# Patient Record
Sex: Male | Born: 1970 | Race: Asian | Hispanic: No | Marital: Married | Smoking: Never smoker
Health system: Southern US, Community
[De-identification: ages and names within clinical notes are randomized; demographics above are authoritative.]

---

## 2006-02-08 ENCOUNTER — Ambulatory Visit: Payer: Self-pay | Admitting: Internal Medicine

## 2007-07-24 ENCOUNTER — Emergency Department (HOSPITAL_COMMUNITY): Admission: EM | Admit: 2007-07-24 | Discharge: 2007-07-25 | Payer: Self-pay | Admitting: Emergency Medicine

## 2013-07-20 ENCOUNTER — Ambulatory Visit: Payer: Managed Care, Other (non HMO) | Admitting: Internal Medicine

## 2013-07-20 ENCOUNTER — Ambulatory Visit: Payer: Managed Care, Other (non HMO)

## 2013-07-20 VITALS — BP 130/90 | HR 74 | Temp 98.0°F | Resp 16 | Ht 65.0 in | Wt 165.0 lb

## 2013-07-20 DIAGNOSIS — M545 Low back pain: Secondary | ICD-10-CM

## 2013-07-20 MED ORDER — MELOXICAM 15 MG PO TABS: 15.0000 mg | ORAL_TABLET | Freq: Every day | ORAL | Status: DC

## 2013-07-20 MED ORDER — CYCLOBENZAPRINE HCL 10 MG PO TABS: 10.0000 mg | ORAL_TABLET | Freq: Three times a day (TID) | ORAL | Status: DC | PRN

## 2013-07-20 NOTE — Patient Instructions (Addendum)
Stop Ibuprofen while taking Meloxicam Take flexeril as directed for muscle spasms, can cause sleepiness so may want to take before bedtime  Referral for physical therapy to help with muscle spasms If you are not improving in 2 weeks please return

## 2013-07-20 NOTE — Progress Notes (Signed)
I have examined this patient along with the student and agree.  

## 2013-07-20 NOTE — Progress Notes (Signed)
Subjective:    Patient ID: Jacob Irwin, male    DOB: 04-02-1971, 42 y.o.   MRN: [id number]  HPI 42 y.o. Male presents to clinic today for back pain x 2.5 years. Patient states that there was no initial injury that caused pain. Patients back pain has gotten worse over the past year. Patient has greatest pain when sitting for long periods of time, bending over, or lying down. The pain radiates down back of both legs but is worse on the right. Patient not having any difficulty urinating or moving bowels. Patient has been taking 400 mg of ibuprofen 2x/day every 2-3 days with some relief at first but now not having as much relief. Patient works for a Psychologist, prison and probation services and states that he does not have to lift very heavy objects or bend over. Patients exam limited by language barrier.    Review of Systems  Constitutional: Positive for activity change (2nd to pain and limited ROM ). Negative for fever, chills, diaphoresis and fatigue.  HENT: Negative for neck pain and neck stiffness.   Eyes: Negative for visual disturbance.  Respiratory: Negative for cough, chest tightness and shortness of breath.   Cardiovascular: Negative for chest pain and palpitations.  Gastrointestinal: Negative for nausea, vomiting, abdominal pain, diarrhea and constipation.  Genitourinary: Negative for difficulty urinating.  Musculoskeletal: Positive for myalgias, back pain, arthralgias and gait problem (2nd to pain in back ).  Skin: Negative for pallor and rash.  Neurological: Negative for weakness, light-headedness and headaches.  All other systems reviewed and are negative.       Objective:   Physical Exam  Nursing note and vitals reviewed. Constitutional: He is oriented to person, place, and time. Vital signs are normal. He appears well-developed and well-nourished. No distress.  HENT:  Head: Normocephalic and atraumatic.  Right Ear: External ear normal.  Left Ear: External ear normal.  Eyes: Conjunctivae are  normal.  Neck: Trachea normal and full passive range of motion without pain. Muscular tenderness (2nd to spasm ) present. No spinous process tenderness present. No rigidity. Decreased range of motion (2nd to muscle spasms and pain in lumbar spine ) present. No thyromegaly present.  Cardiovascular: Normal rate, regular rhythm, normal heart sounds and intact distal pulses.   Pulmonary/Chest: Effort normal and breath sounds normal.  Abdominal: Soft. Bowel sounds are normal.  Musculoskeletal: He exhibits tenderness.       Lumbar back: He exhibits decreased range of motion, tenderness, pain and spasm. He exhibits no bony tenderness, no swelling, no deformity and normal pulse.  Lymphadenopathy:    He has no cervical adenopathy.  Neurological: He is alert and oriented to person, place, and time. He has normal strength and normal reflexes. He displays normal reflexes. No cranial nerve deficit or sensory deficit. He exhibits normal muscle tone. Coordination normal.  Skin: Skin is warm, dry and intact. No rash noted. He is not diaphoretic.  Psychiatric: He has a normal mood and affect. His speech is normal and behavior is normal. Judgment and thought content normal. Cognition and memory are normal.    Lumbar Spine complete primary read by Dr. Merla Riches.  No signs of degenerative changes or acute processes. Loss of normal curvature of lumbar spine due to muscle spasms is evident.  .  Assessment & Plan:  LBP radiating to both legs - Plan: DG Lumbar Spine Complete, cyclobenzaprine (FLEXERIL) 10 MG tablet, meloxicam (MOBIC) 15 MG tablet, Ambulatory referral to Physical Therapy.  Instructed patient to stop Ibuprofen while taking Meloxicam.  Take flexeril as directed for muscle spasms. Warned him that it can cause sleepiness so may want to take before bedtime. Gave him a referral for physical therapy to help with muscle spasms.Instructed him that if he is not improving in 2 weeks to return for further evaluation.       I have reviewed and agree with documentation. Robert P. Merla Riches, M.D.

## 2013-08-03 ENCOUNTER — Ambulatory Visit: Payer: Managed Care, Other (non HMO) | Admitting: Internal Medicine

## 2013-08-03 VITALS — BP 140/88 | HR 93 | Temp 98.3°F | Resp 12 | Ht 64.6 in | Wt 164.0 lb

## 2013-08-03 DIAGNOSIS — M5126 Other intervertebral disc displacement, lumbar region: Secondary | ICD-10-CM

## 2013-08-03 DIAGNOSIS — M545 Low back pain: Secondary | ICD-10-CM

## 2013-08-03 DIAGNOSIS — M5116 Intervertebral disc disorders with radiculopathy, lumbar region: Secondary | ICD-10-CM

## 2013-08-03 MED ORDER — TRAMADOL HCL 50 MG PO TABS: 50.0000 mg | ORAL_TABLET | Freq: Four times a day (QID) | ORAL | Status: DC | PRN

## 2013-08-03 MED ORDER — PREDNISONE 20 MG PO TABS: ORAL_TABLET | ORAL | Status: DC

## 2013-08-03 NOTE — Progress Notes (Signed)
  Subjective:    Patient ID: Jacob Irwin, male    DOB: [birth year], 42 y.o.   MRN: [id number]  HPI here with an interpreter Flexeril seemed to make pain worse Meloxicam did not help Continues with lumbosacral pain on daily basis with radicular symptoms to the right lower leg No GU or GI dysfunction Remembers bending over and having a popping injury a 1 point No numbness or weakness   Review of Systems     Objective:   Physical Exam BP 140/88  Pulse 93  Temp(Src) 98.3 F (36.8 C) (Oral)  Resp 12  Ht 5' 4.6" (1.641 m)  Wt 164 lb (74.39 kg)  BMI 27.62 kg/m2  SpO2 98% No acute distress Tender over lumbosacral area generally Straight leg raise positive on the left at 90 with pain felt on the right//straight leg raise positive at 45 on the right with pain felt the right lumbosacral area Patellar reflexes 2+ symmetrical Clinical reflexes 00 No weakness to muscle testing       Assessment & Plan:  Lumbosacral pain  Lumbar disc herniation with radiculopathy  Meds ordered this encounter  Medications  . predniSONE (DELTASONE) 20 MG tablet    Sig: 4/3/3/2/2/1/1 single daily dose for 7 days    Dispense:  16 tablet    Refill:  0  . traMADol (ULTRAM) 50 MG tablet    Sig: Take 1-2 tablets (50-100 mg total) by mouth every 6 (six) hours as needed for pain.    Dispense:  30 tablet    Refill:  0   2 call if this doesn't work for [place] to refer to orthopedics to consider injection

## 2013-09-06 ENCOUNTER — Ambulatory Visit: Payer: Managed Care, Other (non HMO) | Admitting: Internal Medicine

## 2013-09-06 VITALS — BP 128/84 | HR 77 | Temp 98.1°F | Resp 18 | Ht 65.0 in | Wt 159.0 lb

## 2013-09-06 DIAGNOSIS — R202 Paresthesia of skin: Secondary | ICD-10-CM

## 2013-09-06 DIAGNOSIS — M419 Scoliosis, unspecified: Secondary | ICD-10-CM

## 2013-09-06 DIAGNOSIS — M549 Dorsalgia, unspecified: Secondary | ICD-10-CM

## 2013-09-06 DIAGNOSIS — M4120 Other idiopathic scoliosis, site unspecified: Secondary | ICD-10-CM

## 2013-09-06 MED ORDER — METHOCARBAMOL 750 MG PO TABS: 750.0000 mg | ORAL_TABLET | Freq: Four times a day (QID) | ORAL | Status: DC

## 2013-09-06 MED ORDER — METHYLPREDNISOLONE ACETATE 80 MG/ML IJ SUSP
120.0000 mg | Freq: Once | INTRAMUSCULAR | Status: AC
  Administered 2013-09-06: 120 mg via INTRAMUSCULAR

## 2013-09-06 NOTE — Progress Notes (Signed)
  Subjective:    Patient ID: Jacob Irwin, male    DOB: Jun 12, 1971, 42 y.o.   MRN: [id number]  HPI Pt here c/o of lower back pain radiating to right leg x 2 yrs got worse last 3 mths, bowel movents normal. Tingling right leg. Denies numbness. otc ibuprofen with some relief. No urinary symptoms. Dr. Merla Riches has evaluated and txed twice. Sxs continue, he has scoliosis and right disc signs. Using an interpretor Imaging reviewed  Review of Systems Neg/ [place]    Objective:   Physical Exam  Vitals reviewed. Constitutional: He is oriented to person, place, and time. He appears well-developed and well-nourished.  HENT:  Head: Normocephalic.  Eyes: EOM are normal.  Neck: Neck supple.  Cardiovascular: Normal rate.   Pulmonary/Chest: Effort normal.  Musculoskeletal: He exhibits tenderness.       Back:  Straight leg positive on right with both legs  Neurological: He is alert and oriented to person, place, and time. He has normal strength and normal reflexes. He displays normal reflexes. No cranial nerve deficit or sensory deficit. He exhibits normal muscle tone. Coordination and gait normal.  Skin: No rash noted.  Psychiatric: He has a normal mood and affect.          Assessment & Plan:  Depomedrol Robaxin prn

## 2013-09-06 NOTE — Patient Instructions (Addendum)
Scoliosis Scoliosis is the name given to a spine that curves sideways. It is a common condition found in up to ten percent of adolescents. It is more common in teenage girls. This is sometimes the result of other underlying problems such as unequal leg length or muscular problems. Approximately 70% of the time the cause unknown. It can cause twisting of the shoulders, hips, chest, back, and rib cage. Exercises generally do not affect the course of this disease, but may be helpful in strengthening weak muscle groups. Orthopedic braces may be needed during growth spurts. Surgery may be necessary for progressive cases. HOME CARE INSTRUCTIONS   Your caregiver may suggest exercises to strengthen your muscles. Follow their instructions. Ask your caregiver if you can participate in sports activities.  Bracing may be needed to try to limit the progression of the spinal curve. Wear the brace as instructed by your caregiver.  Follow-up appointments are important. Often mild cases of scoliosis can be kept track of by regular physical exams. However, periodic x-rays may be taken in more severe cases to follow the progress of the curvature, especially with brace treatment. Scoliosis can be corrected or improved if treated early. SEEK IMMEDIATE MEDICAL CARE IF:  You have back pain that is not relieved by medications prescribed by your caregiver.  If there is weakness or increased muscle tone (spasticity) in your legs or any loss of bowel or bladder control. Document Released: 12/02/2000 Document Revised: 02/27/2012 Document Reviewed: 12/22/2008 Exit[employer] Patient Information 2014 [employer].  

## 2014-12-03 IMAGING — CR DG LUMBAR SPINE COMPLETE 4+V
5 series · 5 of 5 positions shown · non-contrast
Comparison: None.

CLINICAL DATA: Chronic low back pain

LUMBAR SPINE - COMPLETE 4+ VIEW

[AP]
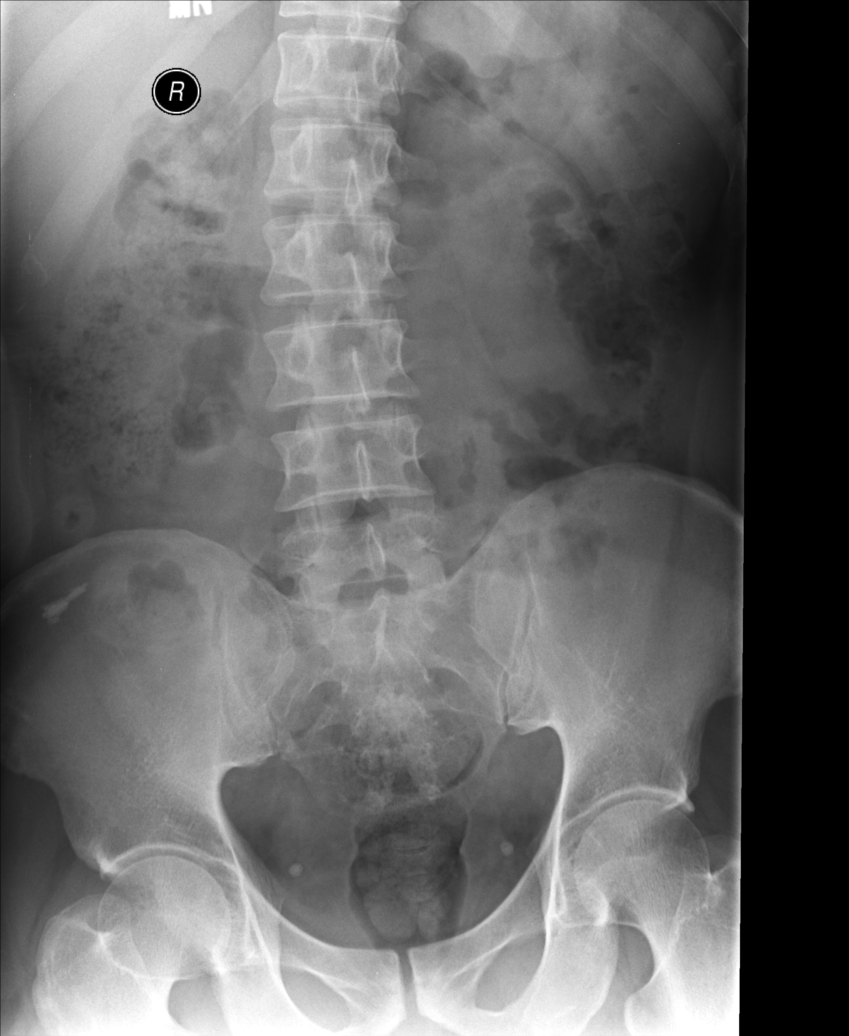

[rpo]
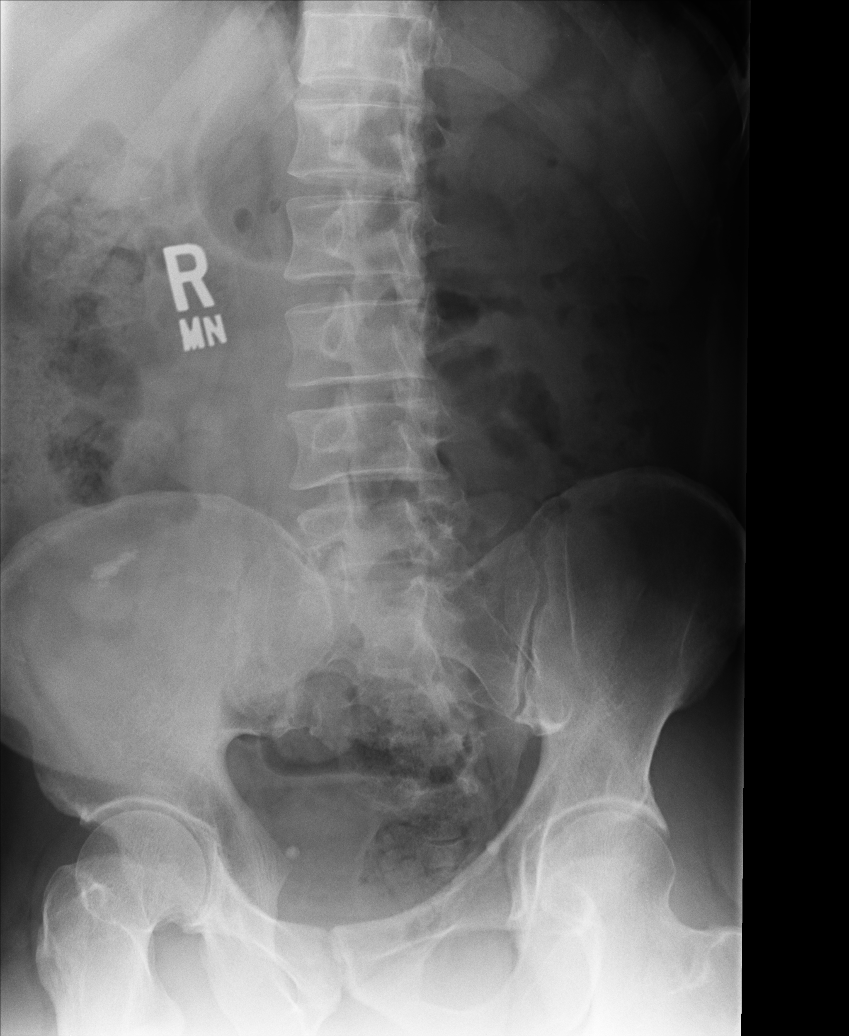

[lpo]
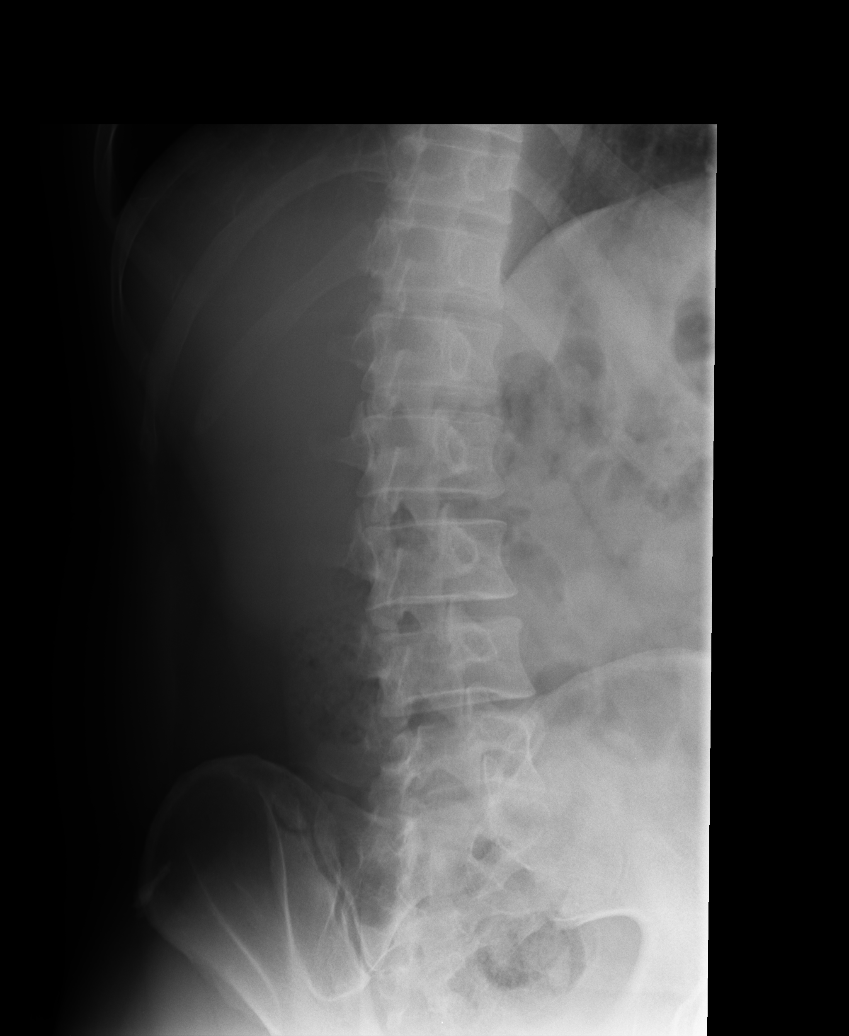

[lateral]
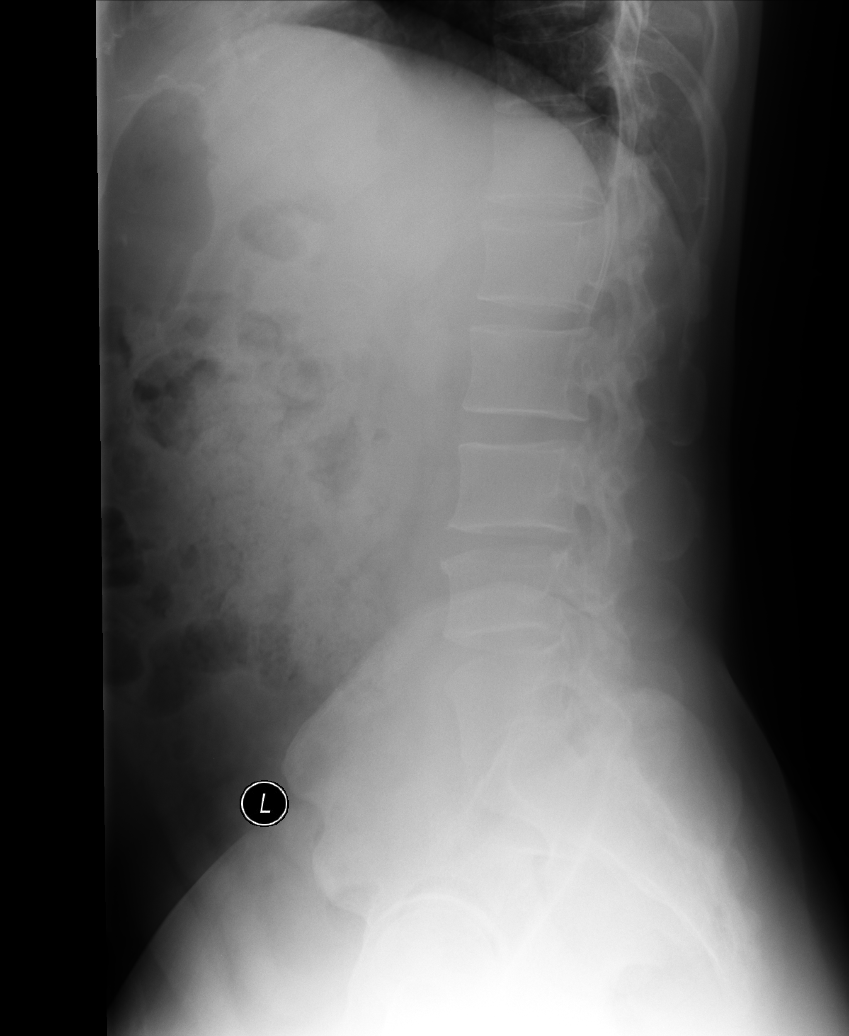

[l5 s1]
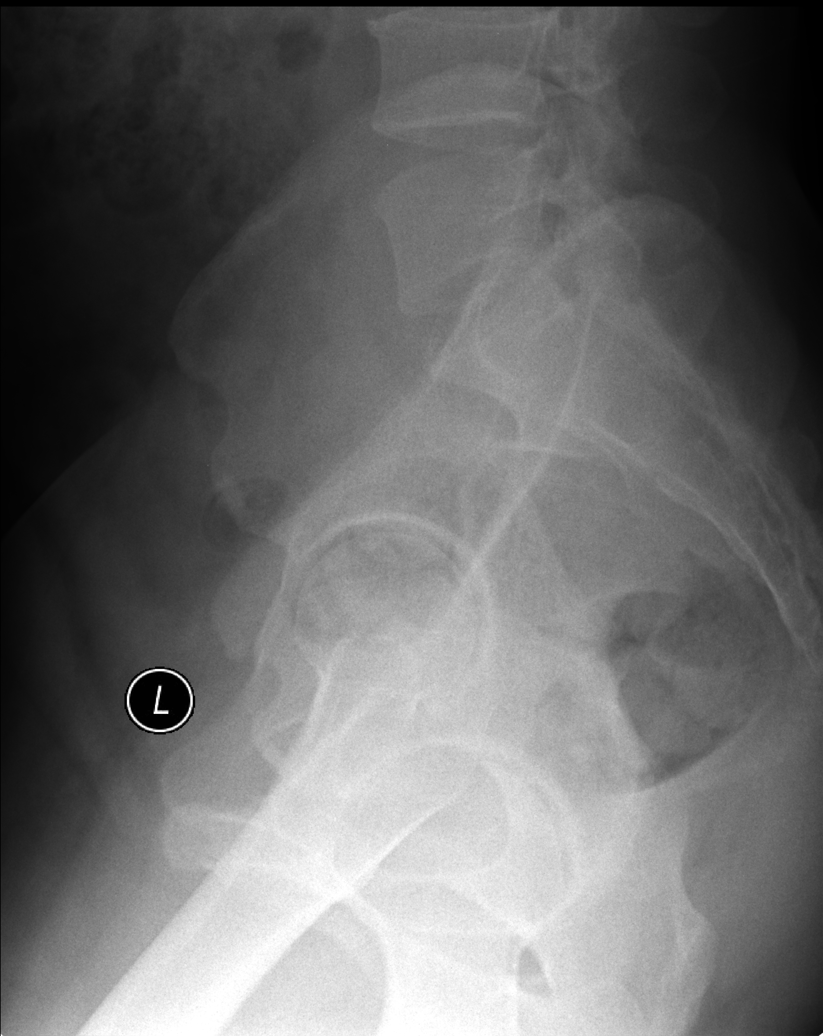

[5 of 5 positions shown; findings below may reference images not displayed]

FINDINGS: AP, lateral and bilateral oblique radiographs of the
lumbosacral spine demonstrate no acute fracture or malalignment.
There is mild rotary and dextroconvex scoliosis of the lumbar spine
with the apex at L2-L3.  There is loss of the normal lumbar
lordosis on the lateral view.  This is likely partially due to the
scoliotic curvature.  No focal degenerative changes.
IMPRESSION: 1.  No acute fracture or malalignment.
2.  Mild rotary and dextroconvex scoliosis with the epicenter at L2-
L3.
3.  Loss of the normal lumbar lordosis may be partially related to
the scoliotic curve and/or muscle spasm.

Clinically significant discrepancy from primary report, if
provided: None

## 2015-06-23 ENCOUNTER — Encounter (HOSPITAL_COMMUNITY): Payer: Self-pay | Admitting: Emergency Medicine

## 2015-06-23 ENCOUNTER — Emergency Department (HOSPITAL_COMMUNITY)
Admission: EM | Admit: 2015-06-23 | Discharge: 2015-06-23 | Disposition: A | Discharge status: Home / Self Care | Payer: Self-pay | Attending: Emergency Medicine | Admitting: Emergency Medicine

## 2015-06-23 ENCOUNTER — Emergency Department (HOSPITAL_COMMUNITY): Payer: Self-pay

## 2015-06-23 DIAGNOSIS — Y9389 Activity, other specified: Secondary | ICD-10-CM | POA: Insufficient documentation

## 2015-06-23 DIAGNOSIS — S39012A Strain of muscle, fascia and tendon of lower back, initial encounter: Secondary | ICD-10-CM | POA: Insufficient documentation

## 2015-06-23 DIAGNOSIS — Y92410 Unspecified street and highway as the place of occurrence of the external cause: Secondary | ICD-10-CM | POA: Insufficient documentation

## 2015-06-23 DIAGNOSIS — Z79899 Other long term (current) drug therapy: Secondary | ICD-10-CM | POA: Insufficient documentation

## 2015-06-23 DIAGNOSIS — Z7952 Long term (current) use of systemic steroids: Secondary | ICD-10-CM | POA: Insufficient documentation

## 2015-06-23 DIAGNOSIS — Y999 Unspecified external cause status: Secondary | ICD-10-CM | POA: Insufficient documentation

## 2015-06-23 MED ORDER — NAPROXEN 500 MG PO TABS: 500.0000 mg | ORAL_TABLET | Freq: Two times a day (BID) | ORAL | Status: DC

## 2015-06-23 MED ORDER — CYCLOBENZAPRINE HCL 10 MG PO TABS: 10.0000 mg | ORAL_TABLET | Freq: Two times a day (BID) | ORAL | Status: DC | PRN
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
## 2019-09-16 ENCOUNTER — Other Ambulatory Visit: Payer: Self-pay

## 2019-09-16 ENCOUNTER — Ambulatory Visit (HOSPITAL_COMMUNITY)
Admission: EM | Admit: 2019-09-16 | Discharge: 2019-09-16 | Disposition: A | Discharge status: Home / Self Care | Payer: 59 | Attending: Family Medicine | Admitting: Family Medicine

## 2019-09-16 ENCOUNTER — Encounter (HOSPITAL_COMMUNITY): Payer: Self-pay

## 2019-09-16 DIAGNOSIS — M545 Low back pain, unspecified: Secondary | ICD-10-CM

## 2019-09-16 MED ORDER — NAPROXEN 500 MG PO TABS: 500.0000 mg | ORAL_TABLET | Freq: Two times a day (BID) | ORAL | 0 refills | Status: AC

## 2019-09-16 MED ORDER — CYCLOBENZAPRINE HCL 5 MG PO TABS: 5.0000 mg | ORAL_TABLET | Freq: Every day | ORAL | 0 refills | Status: AC

## 2019-09-16 NOTE — Discharge Instructions (Addendum)
Light and regular activity as tolerated.  See exercises provided.  Heat application while active can help with muscle spasms.  Sleep with pillow under your knees.  Naproxen twice a day, take with food.  Flexeril at night as a muscle relaxer. May cause drowsiness. Please do not take if driving or drinking alcohol.   Please follow up with sports medicine for long term management.

## 2019-09-16 NOTE — ED Triage Notes (Signed)
Patient presents to Urgent Care with complaints of lower back pain since 4 weeks ago. Patient reports he has not taken meds for his pain.

## 2019-09-16 NOTE — ED Provider Notes (Signed)
[facility]    CSN: [id number] Arrival date & time: 09/16/19  1757      History   Chief Complaint Chief Complaint  Patient presents with  . Back Pain    HPI Jacob Irwin is a 48 y.o. male.   Latavious Bitter presents with complaints of generalized low back pain. Has been ongoing for the past 4 weeks, worsening. No known new injury. He works Programmer, multimedia frames, with some lifting and bending. No numbness tingling or weakness. No saddle symptoms. No loss of bladder or bowel function. No radiation to buttock or thighs. Doesn't follow with a PCP and hasn't seen ortho for this in the past. History of back pain, history of scoliosis. Hasn't taken any medications for pain.   Vietnamese video interpreter used to collect history and physical exam.    ROS per HPI, negative if not otherwise mentioned.      History reviewed. No pertinent past medical history.  There are no active problems to display for this patient.   History reviewed. No pertinent surgical history.     Home Medications    Prior to Admission medications   Medication Sig Start Date End Date Taking? Authorizing Provider  cyclobenzaprine (FLEXERIL) 5 MG tablet Take 1 tablet (5 mg total) by mouth at bedtime. 09/16/19   Georgetta Haber, NP  ibuprofen (ADVIL,MOTRIN) 200 MG tablet Take 200 mg by mouth every 6 (six) hours as needed for pain.    [provider]  naproxen (NAPROSYN) 500 MG tablet Take 1 tablet (500 mg total) by mouth 2 (two) times daily. 09/16/19   Georgetta Haber, NP    Family History Family History  Family history unknown: Yes    Social History Social History   Tobacco Use  . Smoking status: Never Smoker  . Smokeless tobacco: Never Used  Substance Use Topics  . Alcohol use: No  . Drug use: No     Allergies   Patient has no known allergies.   Review of Systems Review of Systems   Physical Exam Triage Vital Signs ED Triage Vitals  Enc Vitals Group     BP 09/16/19 1824  (!) 147/93     Pulse Rate 09/16/19 1824 69     Resp 09/16/19 1824 16     Temp 09/16/19 1824 98.1 F (36.7 C)     Temp Source 09/16/19 1824 Temporal     SpO2 09/16/19 1824 99 %     Weight --      Height --      Head Circumference --      Peak Flow --      Pain Score 09/16/19 1838 6     Pain Loc --      Pain Edu? --      Excl. in GC? --    No data found.  Updated Vital Signs BP (!) 147/93 (BP Location: Left Arm)   Pulse 69   Temp 98.1 F (36.7 C) (Temporal)   Resp 16   SpO2 99%    Physical Exam Constitutional:      Appearance: He is well-developed.  Cardiovascular:     Rate and Rhythm: Normal rate.  Pulmonary:     Effort: Pulmonary effort is normal.  Musculoskeletal:     Lumbar back: He exhibits tenderness, bony tenderness and pain. He exhibits normal range of motion, no swelling, no edema, no deformity, no laceration, no spasm and normal pulse.       Back:  Comments: Low back pain bilaterally as well as to distal lumbar spine without step off; ambulatory without difficulty; strength equal bilaterally; gross sensation intact to lower extremities; pain with straight leg raise bilaterally but no radiation of pain; pain R>L low back; pain with right hip flexion   Skin:    General: Skin is warm and dry.  Neurological:     Mental Status: He is alert and oriented to person, place, and time.      UC Treatments / Results  Labs (all labs ordered are listed, but only abnormal results are displayed) Labs Reviewed - No data to display  EKG   Radiology No results found.  Procedures Procedures (including critical care time)  Medications Ordered in UC Medications - No data to display  Initial Impression / Assessment and Plan / UC Course  I have reviewed the triage vital signs and the nursing notes.  Pertinent labs & imaging results that were available during my care of the patient were reviewed by me and considered in my medical decision making (see chart for  details).     Past imaging reviewed with history of scoliosis. No new injury. No red flag findings. Pain management discussed. Encouraged follow up with ortho and/or sports medicine. Return precautions provided. Patient verbalized understanding and agreeable to plan.  Ambulatory out of clinic without difficulty.    Final Clinical Impressions(s) / UC Diagnoses   Final diagnoses:  Acute bilateral low back pain without sciatica     Discharge Instructions     Light and regular activity as tolerated.  See exercises provided.  Heat application while active can help with muscle spasms.  Sleep with pillow under your knees.  Naproxen twice a day, take with food.  Flexeril at night as a muscle relaxer. May cause drowsiness. Please do not take if driving or drinking alcohol.   Please follow up with sports medicine for long term management.    ED Prescriptions    Medication Sig Dispense Auth. Provider   naproxen (NAPROSYN) 500 MG tablet Take 1 tablet (500 mg total) by mouth 2 (two) times daily. 30 tablet Augusto Gamble B, NP   cyclobenzaprine (FLEXERIL) 5 MG tablet Take 1 tablet (5 mg total) by mouth at bedtime. 15 tablet Zigmund Gottron, NP     PDMP not reviewed this encounter.   Zigmund Gottron, NP 09/16/19 [phone]

## 2020-10-14 ENCOUNTER — Other Ambulatory Visit: Payer: Self-pay

## 2020-10-14 ENCOUNTER — Encounter (HOSPITAL_COMMUNITY): Payer: Self-pay

## 2020-10-14 ENCOUNTER — Ambulatory Visit (INDEPENDENT_AMBULATORY_CARE_PROVIDER_SITE_OTHER): Payer: 59

## 2020-10-14 ENCOUNTER — Ambulatory Visit (HOSPITAL_COMMUNITY)
Admission: EM | Admit: 2020-10-14 | Discharge: 2020-10-14 | Disposition: A | Discharge status: Home / Self Care | Payer: 59 | Attending: Family Medicine | Admitting: Family Medicine

## 2020-10-14 DIAGNOSIS — K5900 Constipation, unspecified: Secondary | ICD-10-CM

## 2020-10-14 DIAGNOSIS — R1084 Generalized abdominal pain: Secondary | ICD-10-CM

## 2020-10-14 DIAGNOSIS — R109 Unspecified abdominal pain: Secondary | ICD-10-CM

## 2020-10-14 MED ORDER — LACTULOSE 20 GM/30ML PO SOLN: 30.0000 mL | Freq: Two times a day (BID) | ORAL | 0 refills | Status: AC | PRN

## 2020-10-14 NOTE — ED Triage Notes (Signed)
Patient in with c.o abdominal pain and constipation that has been going on for a few days.States that whenever he eats it feels like the food is stuck and not digesting  Last BM was last friday  Patient has had OTC miralax with no relief  Denies n/v, heartburn, gas,  Bowel sounds active, no pain on palpation

## 2020-10-14 NOTE — Discharge Instructions (Addendum)
You have been seen today for abdominal pain. Your evaluation was not suggestive of any emergent condition requiring medical intervention at this time. However, some abdominal problems make take more time to appear. Therefore, it is very important for you to pay attention to any new symptoms or worsening of your current condition.  Please proceed to the Emergency Department immediately should you begin to feel worse in any way or have any of the following symptoms: increasing or different abdominal pain, persistent vomiting, inability to drink fluids, fevers, or shaking chills.    

## 2020-10-15 NOTE — ED Provider Notes (Signed)
[facility]   [id number] 10/14/20 Arrival Time: 1534  ASSESSMENT & PLAN:  1. Abdominal discomfort, generalized   2. Constipation, unspecified constipation type     I have personally viewed the imaging studies ordered this visit. No signs of SBO.  Benign abdominal exam. No indications for urgent CT at this time. Very low suspicion of beginngin SBO. Discussed.  Begin trial of: Meds ordered this encounter  Medications  . Lactulose 20 GM/30ML SOLN    Sig: Take 30 mLs (20 g total) by mouth 2 (two) times daily as needed (for constipation).    Dispense:  450 mL    Refill:  0     Discharge Instructions     You have been seen today for abdominal pain. Your evaluation was not suggestive of any emergent condition requiring medical intervention at this time. However, some abdominal problems make take more time to appear. Therefore, it is very important for you to pay attention to any new symptoms or worsening of your current condition.  Please proceed to the Emergency Department immediately should you begin to feel worse in any way or have any of the following symptoms: increasing or different abdominal pain, persistent vomiting, inability to drink fluids, fevers, or shaking chills.       Ensure adeq hydration.  Reviewed expectations re: course of current medical issues. Questions answered. Outlined signs and symptoms indicating need for more acute intervention. Patient verbalized understanding. After Visit Summary given.   SUBJECTIVE: History from: patient. Daughter interpreting. Jacob Irwin is a 49 y.o. male who presents with complaint of abdominal "fullness"; several days; last BM approx 5 d ago; feels constipated. Is not limiting daily activities. Passing normal flatus. Afebrile. Overall decreased PO intake; makes him feel bloated. No n/v/d. No back pain or LE edema. Miralax without relief.  History reviewed. No pertinent surgical history.   OBJECTIVE:  Vitals:    10/14/20 1634  BP: (!) 135/95  Pulse: 68  Resp: 18  Temp: 98 F (36.7 C)  TempSrc: Oral  SpO2: 100%    General appearance: alert, oriented, no acute distress HEENT: ; AT; oropharynx moist Lungs: unlabored respirations Abdomen: soft; without distention; mild  and poorly localized tenderness to palpation over abdomen described as a "full feeling"; present but sparse bowel sounds; without masses or organomegaly; without guarding or rebound tenderness Back: without reported CVA tenderness; FROM at waist Extremities: without LE edema; symmetrical; without gross deformities Skin: warm and dry Neurologic: normal gait Psychological: alert and cooperative; normal mood and affect  Imaging: DG Abd 2 Views  Result Date: 10/14/2020 CLINICAL DATA:  Abdomen pain EXAM: ABDOMEN - 2 VIEW COMPARISON:  None. FINDINGS: Nonobstructed gas pattern. Mild stool in the colon. Nonspecific linear calcification in the right lower quadrant. Probable calcified phleboliths. No free air beneath the diaphragm. IMPRESSION: Nonobstructed gas pattern. Electronically Signed   By: Jasmine Pang M.D.   On: 10/14/2020 17:46     No Known Allergies                                             History reviewed. No pertinent past medical history.  Social History   Socioeconomic History  . Marital status: Married    Spouse name: Not on file  . Number of children: Not on file  . Years of education: Not on file  . Highest education level: Not on  file  Occupational History  . Not on file  Tobacco Use  . Smoking status: Never Smoker  . Smokeless tobacco: Never Used  Vaping Use  . Vaping Use: Never used  Substance and Sexual Activity  . Alcohol use: No  . Drug use: No  . Sexual activity: Yes  Other Topics Concern  . Not on file  Social History Narrative  . Not on file   Social Determinants of Health   Financial Resource Strain:   . Difficulty of Paying Living Expenses: Not on file  Food Insecurity:   .  Worried About Programme researcher, broadcasting/film/video in the Last Year: Not on file  . Ran Out of Food in the Last Year: Not on file  Transportation Needs:   . Lack of Transportation (Medical): Not on file  . Lack of Transportation (Non-Medical): Not on file  Physical Activity:   . Days of Exercise per Week: Not on file  . Minutes of Exercise per Session: Not on file  Stress:   . Feeling of Stress : Not on file  Social Connections:   . Frequency of Communication with Friends and Family: Not on file  . Frequency of Social Gatherings with Friends and Family: Not on file  . Attends Religious Services: Not on file  . Active Member of Clubs or Organizations: Not on file  . Attends Banker Meetings: Not on file  . Marital Status: Not on file  Intimate Partner Violence:   . Fear of Current or Ex-Partner: Not on file  . Emotionally Abused: Not on file  . Physically Abused: Not on file  . Sexually Abused: Not on file    Family History  Family history unknown: Yes     Mardella Layman, MD 10/15/20 1009

## 2021-10-15 ENCOUNTER — Other Ambulatory Visit: Payer: Self-pay | Admitting: Family Medicine

## 2021-10-15 ENCOUNTER — Ambulatory Visit
Admission: RE | Admit: 2021-10-15 | Discharge: 2021-10-15 | Disposition: A | Discharge status: Home / Self Care | Payer: 59 | Source: Ambulatory Visit | Attending: Family Medicine | Admitting: Family Medicine

## 2021-10-15 DIAGNOSIS — M542 Cervicalgia: Secondary | ICD-10-CM

## 2022-07-11 DIAGNOSIS — B372 Candidiasis of skin and nail: Secondary | ICD-10-CM | POA: Diagnosis not present

## 2022-07-11 DIAGNOSIS — L290 Pruritus ani: Secondary | ICD-10-CM | POA: Diagnosis not present

## 2022-07-17 DIAGNOSIS — R350 Frequency of micturition: Secondary | ICD-10-CM | POA: Diagnosis not present

## 2022-07-17 DIAGNOSIS — K5900 Constipation, unspecified: Secondary | ICD-10-CM | POA: Diagnosis not present

## 2022-12-27 DIAGNOSIS — Z0000 Encounter for general adult medical examination without abnormal findings: Secondary | ICD-10-CM | POA: Diagnosis not present

## 2022-12-27 DIAGNOSIS — Z13220 Encounter for screening for lipoid disorders: Secondary | ICD-10-CM | POA: Diagnosis not present

## 2022-12-27 DIAGNOSIS — M542 Cervicalgia: Secondary | ICD-10-CM | POA: Diagnosis not present

## 2022-12-27 DIAGNOSIS — Z125 Encounter for screening for malignant neoplasm of prostate: Secondary | ICD-10-CM | POA: Diagnosis not present

## 2022-12-27 DIAGNOSIS — I10 Essential (primary) hypertension: Secondary | ICD-10-CM | POA: Diagnosis not present

## 2022-12-27 DIAGNOSIS — F17210 Nicotine dependence, cigarettes, uncomplicated: Secondary | ICD-10-CM | POA: Diagnosis not present

## 2023-01-07 DIAGNOSIS — M542 Cervicalgia: Secondary | ICD-10-CM | POA: Diagnosis not present

## 2023-01-13 DIAGNOSIS — I10 Essential (primary) hypertension: Secondary | ICD-10-CM | POA: Diagnosis not present

## 2023-08-14 DIAGNOSIS — R1013 Epigastric pain: Secondary | ICD-10-CM | POA: Diagnosis not present

## 2024-01-05 DIAGNOSIS — Z0000 Encounter for general adult medical examination without abnormal findings: Secondary | ICD-10-CM | POA: Diagnosis not present

## 2024-01-05 DIAGNOSIS — Z125 Encounter for screening for malignant neoplasm of prostate: Secondary | ICD-10-CM | POA: Diagnosis not present

## 2024-01-05 DIAGNOSIS — M542 Cervicalgia: Secondary | ICD-10-CM | POA: Diagnosis not present

## 2024-01-05 DIAGNOSIS — Z13220 Encounter for screening for lipoid disorders: Secondary | ICD-10-CM | POA: Diagnosis not present

## 2024-01-05 DIAGNOSIS — I10 Essential (primary) hypertension: Secondary | ICD-10-CM | POA: Diagnosis not present

## 2024-01-05 DIAGNOSIS — H9313 Tinnitus, bilateral: Secondary | ICD-10-CM | POA: Diagnosis not present

## 2024-02-02 DIAGNOSIS — I10 Essential (primary) hypertension: Secondary | ICD-10-CM | POA: Diagnosis not present

## 2024-02-02 DIAGNOSIS — M5412 Radiculopathy, cervical region: Secondary | ICD-10-CM | POA: Diagnosis not present

## 2024-04-01 DIAGNOSIS — D123 Benign neoplasm of transverse colon: Secondary | ICD-10-CM | POA: Diagnosis not present

## 2024-04-01 DIAGNOSIS — Z1211 Encounter for screening for malignant neoplasm of colon: Secondary | ICD-10-CM | POA: Diagnosis not present

## 2024-06-16 ENCOUNTER — Other Ambulatory Visit: Payer: Self-pay

## 2024-06-16 ENCOUNTER — Emergency Department (HOSPITAL_COMMUNITY)

## 2024-06-16 ENCOUNTER — Emergency Department (HOSPITAL_COMMUNITY)
Admission: EM | Admit: 2024-06-16 | Discharge: 2024-06-16 | Disposition: A | Discharge status: Home / Self Care | Attending: Student | Admitting: Student

## 2024-06-16 DIAGNOSIS — I10 Essential (primary) hypertension: Secondary | ICD-10-CM | POA: Diagnosis not present

## 2024-06-16 DIAGNOSIS — H539 Unspecified visual disturbance: Secondary | ICD-10-CM | POA: Diagnosis not present

## 2024-06-16 DIAGNOSIS — R519 Headache, unspecified: Secondary | ICD-10-CM | POA: Insufficient documentation

## 2024-06-16 DIAGNOSIS — R55 Syncope and collapse: Secondary | ICD-10-CM | POA: Diagnosis not present

## 2024-06-16 DIAGNOSIS — I7771 Dissection of carotid artery: Secondary | ICD-10-CM | POA: Diagnosis not present

## 2024-06-16 DIAGNOSIS — I6612 Occlusion and stenosis of left anterior cerebral artery: Secondary | ICD-10-CM | POA: Diagnosis not present

## 2024-06-16 LAB — CBC WITH DIFFERENTIAL/PLATELET
Abs Immature Granulocytes: 0.03 10*3/uL (ref 0.00–0.07)
Basophils Absolute: 0 10*3/uL (ref 0.0–0.1)
Basophils Relative: 0 %
Eosinophils Absolute: 0.2 10*3/uL (ref 0.0–0.5)
Eosinophils Relative: 2 %
HCT: 45.7 % (ref 39.0–52.0)
Hemoglobin: 15 g/dL (ref 13.0–17.0)
Immature Granulocytes: 0 %
Lymphocytes Relative: 17 %
Lymphs Abs: 1.5 10*3/uL (ref 0.7–4.0)
MCH: 25.9 pg — ABNORMAL LOW (ref 26.0–34.0)
MCHC: 32.8 g/dL (ref 30.0–36.0)
MCV: 78.8 fL — ABNORMAL LOW (ref 80.0–100.0)
Monocytes Absolute: 0.4 10*3/uL (ref 0.1–1.0)
Monocytes Relative: 5 %
Neutro Abs: 6.7 10*3/uL (ref 1.7–7.7)
Neutrophils Relative %: 76 %
Platelets: 229 10*3/uL (ref 150–400)
RBC: 5.8 MIL/uL (ref 4.22–5.81)
RDW: 13.5 % (ref 11.5–15.5)
WBC: 8.8 10*3/uL (ref 4.0–10.5)
nRBC: 0 % (ref 0.0–0.2)

## 2024-06-16 LAB — COMPREHENSIVE METABOLIC PANEL WITH GFR
ALT: 17 U/L (ref 0–44)
AST: 20 U/L (ref 15–41)
Albumin: 4.5 g/dL (ref 3.5–5.0)
Alkaline Phosphatase: 50 U/L (ref 38–126)
Anion gap: 9 (ref 5–15)
BUN: 10 mg/dL (ref 6–20)
CO2: 22 mmol/L (ref 22–32)
Calcium: 8.9 mg/dL (ref 8.9–10.3)
Chloride: 106 mmol/L (ref 98–111)
Creatinine, Ser: 0.62 mg/dL (ref 0.61–1.24)
GFR, Estimated: >60 mL/min (ref >60)
Glucose, Bld: 93 mg/dL (ref 70–99)
Potassium: 3.9 mmol/L (ref 3.5–5.1)
Sodium: 137 mmol/L (ref 135–145)
Total Bilirubin: 0.6 mg/dL (ref 0.0–1.2)
Total Protein: 7.8 g/dL (ref 6.5–8.1)

## 2024-06-16 LAB — URINALYSIS, ROUTINE W REFLEX MICROSCOPIC
Bacteria, UA: NONE SEEN
Bilirubin Urine: NEGATIVE
Glucose, UA: NEGATIVE mg/dL
Ketones, ur: NEGATIVE mg/dL
Leukocytes,Ua: NEGATIVE
Nitrite: NEGATIVE
Protein, ur: NEGATIVE mg/dL
Specific Gravity, Urine: 1.005 (ref 1.005–1.030)
pH: 6 (ref 5.0–8.0)

## 2024-06-16 LAB — TROPONIN I (HIGH SENSITIVITY)
Troponin I (High Sensitivity): 3 ng/L (ref <18)
Troponin I (High Sensitivity): 3 ng/L (ref <18)

## 2024-06-16 MED ORDER — LACTATED RINGERS IV BOLUS
1000.0000 mL | Freq: Once | INTRAVENOUS | Status: AC
  Administered 2024-06-16: 1000 mL via INTRAVENOUS

## 2024-06-16 MED ORDER — PROCHLORPERAZINE EDISYLATE 10 MG/2ML IJ SOLN
10.0000 mg | Freq: Once | INTRAMUSCULAR | Status: AC
  Administered 2024-06-16: 10 mg via INTRAVENOUS
  Filled 2024-06-16: qty 2

## 2024-06-16 MED ORDER — LIDOCAINE 5 % EX PTCH: 1.0000 | MEDICATED_PATCH | CUTANEOUS | 0 refills | Status: AC

## 2024-06-16 MED ORDER — TRAZODONE HCL 50 MG PO TABS: 50.0000 mg | ORAL_TABLET | Freq: Every day | ORAL | 0 refills | Status: AC

## 2024-06-16 MED ORDER — NAPHAZOLINE-PHENIRAMINE 0.025-0.3 % OP SOLN: 1.0000 [drp] | OPHTHALMIC | 0 refills | Status: AC | PRN

## 2024-06-16 MED ORDER — DIPHENHYDRAMINE HCL 50 MG/ML IJ SOLN
12.5000 mg | Freq: Once | INTRAMUSCULAR | Status: AC
  Administered 2024-06-16: 12.5 mg via INTRAVENOUS
  Filled 2024-06-16: qty 1

## 2024-06-16 MED ORDER — IOHEXOL 350 MG/ML SOLN
75.0000 mL | Freq: Once | INTRAVENOUS | Status: AC | PRN
  Administered 2024-06-16: 75 mL via INTRAVENOUS

## 2024-06-16 NOTE — ED Triage Notes (Signed)
 Patient to ED by POV with c/o near syncope on yesterday. Per patient daughter he did not black out or LOC and he is not on thinners. He c/o right shoulder pain that radiates upward causing HA and has HX of HTN.

## 2024-06-16 NOTE — ED Provider Notes (Signed)
 [place] EMERGENCY DEPARTMENT AT [facility] Provider Note  CSN: [id number] Arrival date & time: 06/16/24 1502  Chief Complaint(s) Near Syncope  HPI Jacob Irwin is a 53 y.o. male who presents emergency room for evaluation of near syncope and visual changes.  History obtained from patient's daughter who states that yesterday he was outside working in heat and stood up, got dizzy and had to hold onto to a guardrail near the house for approximate 5 minutes.  He had some sensation of vision darkening and flashers at that time.  This occurred yesterday and over last 24 hours the dizziness has resolved but his vision changes are still present.  His complaints are rather specific stating that symptoms are primarily in the right eye and his vision is clear when looking straight and when looking all the way 90 degrees to the right but when he is turning his head to the right he gets floaters and flashers in the eye.  No associated eye pain.  Denies chest pain, shortness of breath, Donnell pain, nausea, vomiting or other systemic symptoms.  Of note, patient over the last few years has had persistent headaches that start with right shoulder pain, travel up to the neck and into the back of the head.  He has reportedly been seen by a neurologist in the outpatient setting but I cannot see the details of these notes or any outpatient imaging.   Past Medical History No past medical history on file. There are no active problems to display for this patient.  Home Medication(s) Prior to Admission medications   Medication Sig Start Date End Date Taking? Authorizing Provider  lidocaine (LIDODERM) 5 % Place 1 patch onto the skin daily. Remove & Discard patch within 12 hours or as directed by MD 06/16/24  Yes Keitha Kolk, MD  naphazoline-pheniramine (NAPHCON-A) 0.025-0.3 % ophthalmic solution Place 1 drop into both eyes every 4 (four) hours as needed for eye irritation. 06/16/24  Yes Geordie Nooney, MD   traZODone (DESYREL) 50 MG tablet Take 1 tablet (50 mg total) by mouth at bedtime for 3 days. 06/16/24 06/19/24 Yes Jashiya Bassett, MD  cyclobenzaprine  (FLEXERIL ) 5 MG tablet Take 1 tablet (5 mg total) by mouth at bedtime. 09/16/19   Burky, Natalie B, NP  ibuprofen (ADVIL,MOTRIN) 200 MG tablet Take 200 mg by mouth every 6 (six) hours as needed for pain.    [provider]  Lactulose  20 GM/30ML SOLN Take 30 mLs (20 g total) by mouth 2 (two) times daily as needed (for constipation). 10/14/20   Rolinda Rogue, MD  naproxen  (NAPROSYN ) 500 MG tablet Take 1 tablet (500 mg total) by mouth 2 (two) times daily. 09/16/19   Tellis Laneta NOVAK, NP                                                                                                                                    Past Surgical History  No past surgical history on file. Family History Family History  Family history unknown: Yes    Social History Social History   Tobacco Use   Smoking status: Never   Smokeless tobacco: Never  Vaping Use   Vaping status: Never Used  Substance Use Topics   Alcohol use: No   Drug use: No   Allergies Patient has no known allergies.  Review of Systems Review of Systems  Eyes:  Positive for visual disturbance.  Neurological:  Positive for dizziness.    Physical Exam Vital Signs  I have reviewed the triage vital signs BP 132/88   Pulse 71   Temp 98.4 F (36.9 C) (Oral)   Resp 14   Ht 5' 4 (1.626 m)   Wt 67.6 kg   SpO2 99%   BMI 25.58 kg/m   Physical Exam Constitutional:      General: He is not in acute distress.    Appearance: Normal appearance.  HENT:     Head: Normocephalic and atraumatic.     Nose: No congestion or rhinorrhea.   Eyes:     General:        Right eye: No discharge.        Left eye: No discharge.     Extraocular Movements: Extraocular movements intact.     Pupils: Pupils are equal, round, and reactive to light.    Cardiovascular:     Rate and Rhythm: Normal  rate and regular rhythm.     Heart sounds: No murmur heard. Pulmonary:     Effort: No respiratory distress.     Breath sounds: No wheezing or rales.  Abdominal:     General: There is no distension.     Tenderness: There is no abdominal tenderness.   Musculoskeletal:        General: Normal range of motion.     Cervical back: Normal range of motion.   Skin:    General: Skin is warm and dry.   Neurological:     General: No focal deficit present.     Mental Status: He is alert.     Cranial Nerves: No cranial nerve deficit.     Sensory: No sensory deficit.     Motor: No weakness.     ED Results and Treatments Labs (all labs ordered are listed, but only abnormal results are displayed) Labs Reviewed  CBC WITH DIFFERENTIAL/PLATELET - Abnormal; Notable for the following components:      Result Value   MCV 78.8 (*)    MCH 25.9 (*)    All other components within normal limits  URINALYSIS, ROUTINE W REFLEX MICROSCOPIC - Abnormal; Notable for the following components:   Color, Urine STRAW (*)    Hgb urine dipstick SMALL (*)    All other components within normal limits  COMPREHENSIVE METABOLIC PANEL WITH GFR  TROPONIN I (HIGH SENSITIVITY)  TROPONIN I (HIGH SENSITIVITY)  Radiology CT ANGIO HEAD NECK W WO CM Result Date: 06/16/2024 CLINICAL DATA:  Provided history: Carotid artery dissection suspected. Additional history provided: Right shoulder pain with headache, history of hypertension. EXAM: CT ANGIOGRAPHY HEAD AND NECK WITH AND WITHOUT CONTRAST TECHNIQUE: Multidetector CT imaging of the head and neck was performed using the standard protocol during bolus administration of intravenous contrast. Multiplanar CT image reconstructions and MIPs were obtained to evaluate the vascular anatomy. Carotid stenosis measurements (when applicable) are obtained utilizing NASCET  criteria, using the distal internal carotid diameter as the denominator. RADIATION DOSE REDUCTION: This exam was performed according to the departmental dose-optimization program which includes automated exposure control, adjustment of the mA and/or kV according to patient size and/or use of iterative reconstruction technique. CONTRAST:  75mL OMNIPAQUE IOHEXOL 350 MG/ML SOLN COMPARISON:  Head CT 07/25/2007. FINDINGS: CT HEAD FINDINGS Brain: Cerebral volume is normal. There is no acute intracranial hemorrhage. No demarcated cortical infarct. No extra-axial fluid collection. No evidence of an intracranial mass. No midline shift. Vascular: Reported below. Skull: No calvarial fracture or aggressive osseous lesion. Sinuses/Orbits: No orbital mass or acute orbital finding. Mild mucosal thickening within the right frontal sinus. Moderate bilateral ethmoid sinusitis. Small-volume secretions within the right sphenoid sinus. Minor mucosal thickening within the bilateral maxillary sinuses. Review of the MIP images confirms the above findings CTA NECK FINDINGS Aortic arch: Standard aortic branching. The visible thoracic aorta is normal in caliber. Streak/beam hardening artifact arising from a dense contrast bolus partially obscures the left subclavian artery. Within this limitation, there is no appreciable hemodynamically significant innominate or proximal subclavian artery stenosis. Right carotid system: CCA and ICA patent within the neck without stenosis or significant atherosclerotic disease. No evidence of a section. Left carotid system: CCA and ICA patent within the neck without stenosis or significant atherosclerotic disease. No evidence of a section. Vertebral arteries: The right vertebral artery is dominant. Venous reflux of contrast partially obscures the left vertebral artery V1 segment. Within this limitation, the vertebral arteries are patent within the neck without stenosis or significant atherosclerotic disease.  No evidence of dissection. Skeleton: No acute fracture or aggressive osseous lesion. Other neck: No neck mass or cervical lymphadenopathy. Upper chest: No consolidation within the imaged lung apices. Review of the MIP images confirms the above findings CTA HEAD FINDINGS Anterior circulation: The intracranial internal carotid arteries are patent. The M1 middle cerebral arteries are patent. No M2 proximal branch occlusion or high-grade proximal stenosis. The anterior cerebral arteries are patent. Moderate stenosis within the left ACA A4 segment (series 19, image 25). No intracranial aneurysm is identified. Posterior circulation: The intracranial vertebral arteries are patent. The left vertebral artery remains developmentally diminutive intracranially. The basilar artery is patent. The posterior cerebral arteries are patent. Posterior communicating arteries are diminutive or absent, bilaterally. Venous sinuses: Within the limitations of contrast timing, no convincing thrombus. Anatomic variants: As described. Review of the MIP images confirms the above findings IMPRESSION: Non-contrast head CT: 1.  No evidence of an acute intracranial abnormality. 2. Paranasal sinus disease as described. CTA neck: Venous reflux of contrast partially obscures the left vertebral artery V1 segment. Within this limitation, the common carotid, internal carotid and vertebral arteries are patent within the neck without stenosis or significant atherosclerotic disease. No evidence of dissection. CTA head: 1. No proximal intracranial large vessel occlusion or high-grade proximal arterial stenosis identified. 2. 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 Electronically Signed   By: Rockey Childs D.O.   On:  06/16/2024 20:09    Pertinent labs & imaging results that were available during my care of the patient were reviewed by me and considered in my medical decision making (see MDM for details).  Medications Ordered  in ED Medications  lactated ringers bolus 1,000 mL (0 mLs Intravenous Stopped 06/16/24 2041)  iohexol (OMNIPAQUE) 350 MG/ML injection 75 mL (75 mLs Intravenous Contrast Given 06/16/24 1657)  prochlorperazine (COMPAZINE) injection 10 mg (10 mg Intravenous Given 06/16/24 1938)  diphenhydrAMINE (BENADRYL) injection 12.5 mg (12.5 mg Intravenous Given 06/16/24 1938)                                                                                                                                     Procedures Procedures  (including critical care time)  Medical Decision Making / ED Course   This patient presents to the ED for concern of headache, neck pain, near syncope, vision changes, this involves an extensive number of treatment options, and is a complaint that carries with it a high risk of complications and morbidity.  The differential diagnosis includes orthostatic near-syncope, dehydration, cardiogenic near syncope, carotid dissection, TIA, amaurosis fugax, retinal detachment, dry eye  MDM: Patient seen emerged part for evaluation of multiple complaints as described above.  Physical exam with some bilateral conjunctival injection but is otherwise unremarkable.  Laboratory evaluation is unremarkable including negative high-sensitivity troponin.  As patient's visual symptoms appear to be positional with head movement and associated with neck pain, a CT angio was obtained to rule out carotid dissection which was reassuringly negative.  Patient given headache cocktail with improvement of symptoms.  Family is requesting help with patient's shoulder pain at night and with medication to help him sleep.  I told him that I will give him very short prescription for trazodone but do not want him on this medication long-term.  Also placed an outpatient referral to ophthalmology to have an ocular exam performed this week.  At this time he does not meet inpatient criteria for admission will be discharged outpatient  follow-up.   Additional history obtained: -Additional history obtained from multiple family members -External records from outside source obtained and reviewed including: Chart review including previous notes, labs, imaging, consultation notes   Lab Tests: -I ordered, reviewed, and interpreted labs.   The pertinent results include:   Labs Reviewed  CBC WITH DIFFERENTIAL/PLATELET - Abnormal; Notable for the following components:      Result Value   MCV 78.8 (*)    MCH 25.9 (*)    All other components within normal limits  URINALYSIS, ROUTINE W REFLEX MICROSCOPIC - Abnormal; Notable for the following components:   Color, Urine STRAW (*)    Hgb urine dipstick SMALL (*)    All other components within normal limits  COMPREHENSIVE METABOLIC PANEL WITH GFR  TROPONIN I (HIGH SENSITIVITY)  TROPONIN I (HIGH SENSITIVITY)      EKG   EKG Interpretation Date/Time:  Sunday June 16 2024 15:38:42 EDT Ventricular Rate:  75 PR Interval:  173 QRS Duration:  72 QT Interval:  355 QTC Calculation: 397 R Axis:   66  Text Interpretation: Sinus rhythm RSR' in V1 or V2, probably normal variant Confirmed by Batul Diego ([id number]) on 06/16/2024 6:55:25 PM         Imaging Studies ordered: I ordered imaging studies including CT angio head neck I independently visualized and interpreted imaging. I agree with the radiologist interpretation   Medicines ordered and prescription drug management: Meds ordered this encounter  Medications   lactated ringers bolus 1,000 mL   iohexol (OMNIPAQUE) 350 MG/ML injection 75 mL   prochlorperazine (COMPAZINE) injection 10 mg   diphenhydrAMINE (BENADRYL) injection 12.5 mg   lidocaine (LIDODERM) 5 %    Sig: Place 1 patch onto the skin daily. Remove & Discard patch within 12 hours or as directed by MD    Dispense:  30 patch    Refill:  0   traZODone (DESYREL) 50 MG tablet    Sig: Take 1 tablet (50 mg total) by mouth at bedtime for 3 days.    Dispense:  3  tablet    Refill:  0   naphazoline-pheniramine (NAPHCON-A) 0.025-0.3 % ophthalmic solution    Sig: Place 1 drop into both eyes every 4 (four) hours as needed for eye irritation.    Dispense:  15 mL    Refill:  0    -I have reviewed the patients home medicines and have made adjustments as needed  Critical interventions none  Cardiac Monitoring: The patient was maintained on a cardiac monitor.  I personally viewed and interpreted the cardiac monitored which showed an underlying rhythm of: none  Social Determinants of Health:  Factors impacting patients care include: [place]   Reevaluation: After the interventions noted above, I reevaluated the patient and found that they have :improved  Co morbidities that complicate the patient evaluation No past medical history on file.    Dispostion: I considered admission for this patient, but at this time he does not meet inpatient criteria for admission and will be discharged outpatient follow-up     Final Clinical Impression(s) / ED Diagnoses Final diagnoses:  Near syncope  Vision changes  Acute nonintractable headache, unspecified headache type     @PCDICTATION @    Albertina Dixon, MD 06/16/24 2107

## 2024-07-04 DIAGNOSIS — M542 Cervicalgia: Secondary | ICD-10-CM | POA: Diagnosis not present

## 2024-07-04 DIAGNOSIS — R519 Headache, unspecified: Secondary | ICD-10-CM | POA: Diagnosis not present

## 2024-07-04 DIAGNOSIS — I10 Essential (primary) hypertension: Secondary | ICD-10-CM | POA: Diagnosis not present

## 2024-07-04 DIAGNOSIS — H9313 Tinnitus, bilateral: Secondary | ICD-10-CM | POA: Diagnosis not present

## 2024-10-14 DIAGNOSIS — M542 Cervicalgia: Secondary | ICD-10-CM | POA: Diagnosis not present
